# Patient Record
Sex: Female | Born: 1980 | Race: White | Hispanic: No | Marital: Married | Smoking: Never smoker
Health system: Southern US, Community
[De-identification: ages and names within clinical notes are randomized; demographics above are authoritative.]

## PROBLEM LIST (undated history)

## (undated) DIAGNOSIS — F32A Depression, unspecified: Secondary | ICD-10-CM

## (undated) DIAGNOSIS — R011 Cardiac murmur, unspecified: Secondary | ICD-10-CM

## (undated) DIAGNOSIS — M419 Scoliosis, unspecified: Secondary | ICD-10-CM

## (undated) DIAGNOSIS — F329 Major depressive disorder, single episode, unspecified: Secondary | ICD-10-CM

## (undated) DIAGNOSIS — T7840XA Allergy, unspecified, initial encounter: Secondary | ICD-10-CM

## (undated) HISTORY — PX: WISDOM TOOTH EXTRACTION: SHX21

---

## 2007-02-02 ENCOUNTER — Other Ambulatory Visit: Admission: RE | Admit: 2007-02-02 | Discharge: 2007-02-02 | Payer: Self-pay | Admitting: *Deleted

## 2008-10-12 ENCOUNTER — Other Ambulatory Visit: Admission: RE | Admit: 2008-10-12 | Discharge: 2008-10-12 | Payer: Self-pay | Admitting: Family Medicine

## 2008-11-19 ENCOUNTER — Other Ambulatory Visit: Admission: RE | Admit: 2008-11-19 | Discharge: 2008-11-19 | Payer: Self-pay | Admitting: Family Medicine

## 2009-12-05 ENCOUNTER — Other Ambulatory Visit: Admission: RE | Admit: 2009-12-05 | Discharge: 2009-12-05 | Payer: Self-pay | Admitting: Family Medicine

## 2010-02-02 ENCOUNTER — Emergency Department (HOSPITAL_COMMUNITY): Admission: EM | Admit: 2010-02-02 | Discharge: 2010-02-02 | Payer: Self-pay | Admitting: Family Medicine

## 2011-02-23 LAB — POCT URINALYSIS DIP (DEVICE)
Specific Gravity, Urine: 1.02 (ref 1.005–1.030)
Urobilinogen, UA: 1 mg/dL (ref 0.0–1.0)

## 2011-02-23 LAB — POCT PREGNANCY, URINE: Preg Test, Ur: NEGATIVE

## 2011-06-17 ENCOUNTER — Other Ambulatory Visit: Payer: Self-pay | Admitting: Family Medicine

## 2011-06-17 ENCOUNTER — Other Ambulatory Visit (HOSPITAL_COMMUNITY)
Admission: RE | Admit: 2011-06-17 | Discharge: 2011-06-17 | Disposition: A | Discharge status: Home / Self Care | Payer: BC Managed Care – PPO | Source: Ambulatory Visit | Attending: Family Medicine | Admitting: Family Medicine

## 2011-06-17 DIAGNOSIS — Z01419 Encounter for gynecological examination (general) (routine) without abnormal findings: Secondary | ICD-10-CM | POA: Insufficient documentation

## 2011-11-23 ENCOUNTER — Emergency Department (HOSPITAL_COMMUNITY)
Admission: EM | Admit: 2011-11-23 | Discharge: 2011-11-23 | Disposition: A | Discharge status: Home / Self Care | Payer: BC Managed Care – PPO | Source: Home / Self Care | Attending: Emergency Medicine | Admitting: Emergency Medicine

## 2011-11-23 ENCOUNTER — Encounter: Payer: Self-pay | Admitting: *Deleted

## 2011-11-23 DIAGNOSIS — J391 Other abscess of pharynx: Secondary | ICD-10-CM

## 2011-11-23 DIAGNOSIS — R6889 Other general symptoms and signs: Secondary | ICD-10-CM

## 2011-11-23 DIAGNOSIS — J392 Other diseases of pharynx: Secondary | ICD-10-CM

## 2011-11-23 HISTORY — DX: Major depressive disorder, single episode, unspecified: F32.9

## 2011-11-23 HISTORY — DX: Allergy, unspecified, initial encounter: T78.40XA

## 2011-11-23 HISTORY — DX: Depression, unspecified: F32.A

## 2011-11-23 MED ORDER — GUAIFENESIN-CODEINE 100-10 MG/5ML PO SYRP: 5.0000 mL | ORAL_SOLUTION | Freq: Three times a day (TID) | ORAL | Status: AC | PRN

## 2011-11-23 NOTE — ED Notes (Signed)
Fever  Cough   Body  Aches      Chills    Symptoms      Started  Almost  2  Weeks  Ago  Got  Worse  With  Fever  Last  Pm        Symptoms  Not  releived  By  otc  meds

## 2011-11-23 NOTE — ED Provider Notes (Signed)
History     CSN: [id number]  Arrival date & time 11/23/11  9147   First MD Initiated Contact with Patient 11/23/11 1032      Chief Complaint  Patient presents with  . Cough    (Consider location/radiation/quality/duration/timing/severity/associated sxs/prior treatment) HPI Comments: Coughing x 2 weeks, bit the congestion and sore throat with fevers started 2 days ago"  Patient is a 30 y.o. female presenting with cough. The history is provided by the patient.  Cough This is a new problem. The current episode started more than 2 days ago. The problem occurs constantly. The cough is non-productive. The maximum temperature recorded prior to her arrival was 101 to 101.9 F. Associated symptoms include rhinorrhea. Pertinent negatives include no chest pain, no chills, no weight loss, no ear congestion, no myalgias, no shortness of breath and no wheezing. She has tried decongestants for the symptoms. Her past medical history does not include bronchitis, pneumonia, COPD, emphysema or asthma.    Past Medical History  Diagnosis Date  . Allergic   . Depression     History reviewed. No pertinent past surgical history.  Family History  Problem Relation Age of Onset  . Asthma Mother   . Cancer Father   . Diabetes Father   . Heart failure Other     History  Substance Use Topics  . Smoking status: Never Smoker   . Smokeless tobacco: Not on file  . Alcohol Use: Yes    OB History    Grav Para Term Preterm Abortions TAB SAB Ect Mult Living                  Review of Systems  Constitutional: Negative for fever, chills and weight loss.  HENT: Positive for rhinorrhea.   Respiratory: Positive for cough. Negative for shortness of breath and wheezing.   Cardiovascular: Negative for chest pain.  Musculoskeletal: Negative for myalgias.    Allergies  Sulfa antibiotics  Home Medications   Current Outpatient Rx  Name Route Sig Dispense Refill  . MOMETASONE FUROATE 50 MCG/ACT NA  SUSP Nasal Place 2 sprays into the nose daily.      Marland Kitchen NORGESTIMATE-ETH ESTRADIOL 0.25-35 MG-MCG PO TABS Oral Take 1 tablet by mouth daily.      . VENLAFAXINE HCL 75 MG PO CP24 Oral Take 75 mg by mouth daily.      . GUAIFENESIN-CODEINE 100-10 MG/5ML PO SYRP Oral Take 5 mLs by mouth 3 (three) times daily as needed for cough. 120 mL 0    BP 114/72  Pulse 86  Temp(Src) 101.8 F (38.8 C) (Oral)  Resp 16  SpO2 100%  LMP 10/24/2011  Physical Exam  Nursing note and vitals reviewed. Constitutional: She appears well-developed and well-nourished.  Eyes: Conjunctivae are normal.  Neck: No JVD present.  Pulmonary/Chest: No respiratory distress. She has no wheezes. She has no rales.  Lymphadenopathy:    She has no cervical adenopathy.    ED Course  Procedures (including critical care time)  Labs Reviewed - No data to display No results found.   1. Influenza-like symptoms   2. Pharyngeal abscess       MDM  URI- ILI- with pots-nasal dripping        Jimmie Molly, MD 11/23/11 1800

## 2012-06-21 ENCOUNTER — Other Ambulatory Visit: Payer: Self-pay | Admitting: Family Medicine

## 2012-06-21 ENCOUNTER — Other Ambulatory Visit (HOSPITAL_COMMUNITY)
Admission: RE | Admit: 2012-06-21 | Discharge: 2012-06-21 | Disposition: A | Discharge status: Home / Self Care | Payer: BC Managed Care – PPO | Source: Ambulatory Visit | Attending: Family Medicine | Admitting: Family Medicine

## 2012-06-21 DIAGNOSIS — Z01419 Encounter for gynecological examination (general) (routine) without abnormal findings: Secondary | ICD-10-CM | POA: Insufficient documentation

## 2012-10-16 LAB — OB RESULTS CONSOLE RPR: RPR: NONREACTIVE

## 2012-10-16 LAB — OB RESULTS CONSOLE ABO/RH

## 2012-10-16 LAB — OB RESULTS CONSOLE ANTIBODY SCREEN: Antibody Screen: NEGATIVE

## 2012-11-30 NOTE — L&D Delivery Note (Signed)
SVD of VMI at 1217 on 05/04/13; APGARs 9,9, EBL 450cc; cord pH pending. Fetal heart tones in 90s x 3 minutes while crowning.  The patient was counseled for episiotomy vs vacuum assisted deliver.  She elected to proceed with vacuum assistance.  Kiwi was applied with a single push/pull effort affecting delivery of the head.  Head delivered OA with mouth and nose bulb suctioned.  Loose nuchal cord x 2 reduced.  Body delivered atraumatically.  Cord was clamped, cut, and baby to abdomen.  Cord pH obtained.  Placenta delivered S/I/3VC.  Fundus firmed with pitocin and massage.  Perineum intact.  Right periurethral laceration noted and hemostatic without repair.  Mom and baby stable.

## 2013-05-03 ENCOUNTER — Encounter (HOSPITAL_COMMUNITY): Payer: Self-pay | Admitting: *Deleted

## 2013-05-03 ENCOUNTER — Inpatient Hospital Stay (HOSPITAL_COMMUNITY)
Admission: AD | Admit: 2013-05-03 | Discharge: 2013-05-06 | DRG: 775 | Disposition: A | Discharge status: Home / Self Care | Payer: PRIVATE HEALTH INSURANCE | Source: Ambulatory Visit | Attending: Obstetrics and Gynecology | Admitting: Obstetrics and Gynecology

## 2013-05-03 DIAGNOSIS — O4290 Premature rupture of membranes, unspecified as to length of time between rupture and onset of labor, unspecified weeks of gestation: Principal | ICD-10-CM | POA: Diagnosis present

## 2013-05-03 HISTORY — DX: Cardiac murmur, unspecified: R01.1

## 2013-05-03 HISTORY — DX: Scoliosis, unspecified: M41.9

## 2013-05-03 LAB — CBC
Hemoglobin: 12.5 g/dL (ref 12.0–15.0)
MCH: 30.9 pg (ref 26.0–34.0)
RBC: 4.04 MIL/uL (ref 3.87–5.11)
WBC: 13.6 10*3/uL — ABNORMAL HIGH (ref 4.0–10.5)

## 2013-05-03 MED ORDER — LIDOCAINE HCL (PF) 1 % IJ SOLN
30.0000 mL | INTRAMUSCULAR | Status: DC | PRN
  Filled 2013-05-03: qty 30

## 2013-05-03 MED ORDER — ONDANSETRON HCL 4 MG/2ML IJ SOLN
4.0000 mg | Freq: Four times a day (QID) | INTRAMUSCULAR | Status: DC | PRN
  Administered 2013-05-04 (×2): 4 mg via INTRAVENOUS
  Filled 2013-05-03 (×3): qty 2

## 2013-05-03 MED ORDER — SODIUM CHLORIDE 0.9 % IV SOLN: 250.0000 mL | INTRAVENOUS | Status: DC | PRN

## 2013-05-03 MED ORDER — ACETAMINOPHEN 325 MG PO TABS: 650.0000 mg | ORAL_TABLET | ORAL | Status: DC | PRN

## 2013-05-03 MED ORDER — DIPHENHYDRAMINE HCL 50 MG/ML IJ SOLN: 12.5000 mg | INTRAMUSCULAR | Status: DC | PRN

## 2013-05-03 MED ORDER — BUTORPHANOL TARTRATE 1 MG/ML IJ SOLN: 1.0000 mg | INTRAMUSCULAR | Status: DC | PRN

## 2013-05-03 MED ORDER — OXYCODONE-ACETAMINOPHEN 5-325 MG PO TABS: 1.0000 | ORAL_TABLET | ORAL | Status: DC | PRN

## 2013-05-03 MED ORDER — SODIUM CHLORIDE 0.9 % IJ SOLN: 3.0000 mL | INTRAMUSCULAR | Status: DC | PRN

## 2013-05-03 MED ORDER — IBUPROFEN 600 MG PO TABS: 600.0000 mg | ORAL_TABLET | Freq: Four times a day (QID) | ORAL | Status: DC | PRN

## 2013-05-03 MED ORDER — PHENYLEPHRINE 40 MCG/ML (10ML) SYRINGE FOR IV PUSH (FOR BLOOD PRESSURE SUPPORT)
80.0000 ug | PREFILLED_SYRINGE | INTRAVENOUS | Status: DC | PRN
  Filled 2013-05-03: qty 2
  Filled 2013-05-03: qty 5

## 2013-05-03 MED ORDER — EPHEDRINE 5 MG/ML INJ
10.0000 mg | INTRAVENOUS | Status: DC | PRN
  Filled 2013-05-03: qty 2

## 2013-05-03 MED ORDER — SODIUM CHLORIDE 0.9 % IV SOLN
2.0000 g | Freq: Once | INTRAVENOUS | Status: AC
  Administered 2013-05-03: 2 g via INTRAVENOUS
  Filled 2013-05-03: qty 2000

## 2013-05-03 MED ORDER — FENTANYL 2.5 MCG/ML BUPIVACAINE 1/10 % EPIDURAL INFUSION (WH - ANES)
14.0000 mL/h | INTRAMUSCULAR | Status: DC | PRN
  Filled 2013-05-03: qty 125

## 2013-05-03 MED ORDER — LACTATED RINGERS IV SOLN
500.0000 mL | Freq: Once | INTRAVENOUS | Status: AC
  Administered 2013-05-04: 500 mL via INTRAVENOUS

## 2013-05-03 MED ORDER — SODIUM CHLORIDE 0.9 % IJ SOLN: 3.0000 mL | Freq: Two times a day (BID) | INTRAMUSCULAR | Status: DC

## 2013-05-03 MED ORDER — OXYTOCIN BOLUS FROM INFUSION: 500.0000 mL | INTRAVENOUS | Status: DC

## 2013-05-03 MED ORDER — OXYTOCIN 40 UNITS IN LACTATED RINGERS INFUSION - SIMPLE MED
62.5000 mL/h | INTRAVENOUS | Status: DC
  Administered 2013-05-04: 500 mL/h via INTRAVENOUS
  Filled 2013-05-03: qty 1000

## 2013-05-03 MED ORDER — CITRIC ACID-SODIUM CITRATE 334-500 MG/5ML PO SOLN: 30.0000 mL | ORAL | Status: DC | PRN

## 2013-05-03 MED ORDER — LACTATED RINGERS IV SOLN: 500.0000 mL | INTRAVENOUS | Status: DC | PRN

## 2013-05-03 MED ORDER — FLEET ENEMA 7-19 GM/118ML RE ENEM: 1.0000 | ENEMA | RECTAL | Status: DC | PRN

## 2013-05-03 MED ORDER — PHENYLEPHRINE 40 MCG/ML (10ML) SYRINGE FOR IV PUSH (FOR BLOOD PRESSURE SUPPORT)
80.0000 ug | PREFILLED_SYRINGE | INTRAVENOUS | Status: DC | PRN
  Filled 2013-05-03: qty 2

## 2013-05-03 MED ORDER — EPHEDRINE 5 MG/ML INJ
10.0000 mg | INTRAVENOUS | Status: DC | PRN
  Filled 2013-05-03: qty 4
  Filled 2013-05-03: qty 2

## 2013-05-03 NOTE — H&P (Signed)
Mariah Carson is a 32 y.o. female presenting for SROM last pm. Increased leaking of clear AF today requiring Depends. Some UCs. No fever, no HA, no vision change, no epigastric pain. Maternal Medical History:  Reason for admission: Rupture of membranes.   Contractions: Onset was 13-24 hours ago.   Frequency: irregular.   Perceived severity is moderate.    Fetal activity: Perceived fetal activity is normal.      OB History   Grav Para Term Preterm Abortions TAB SAB Ect Mult Living   1              Past Medical History  Diagnosis Date  . Allergic   . Depression   . Heart murmur    Past Surgical History  Procedure Laterality Date  . Wisdom tooth extraction Bilateral    Family History: family history includes Asthma in her mother; Cancer in her father; Diabetes in her father; and Heart failure in her other. Social History:  reports that she has never smoked. She has never used smokeless tobacco. She reports that she does not drink alcohol. Her drug history is not on file.   Prenatal Transfer Tool  Maternal Diabetes: No Genetic Screening: Normal Maternal Ultrasounds/Referrals: Normal Fetal Ultrasounds or other Referrals:  None Maternal Substance Abuse:  No Significant Maternal Medications:  None Significant Maternal Lab Results:  None Other Comments:  None  Review of Systems  Constitutional: Negative for fever.  Eyes: Negative for blurred vision.  Gastrointestinal: Negative for abdominal pain.  Neurological: Negative for headaches.    Dilation: 3.5 Effacement (%): 80 Station: -1;0 Exam by:: L Lamon RN Blood pressure 119/80, pulse 60, temperature 97.8 F (36.6 C), temperature source Oral, height 5\' 3"  (1.6 m), weight 152 lb (68.947 kg), last menstrual period 08/08/2012. Maternal Exam:  Uterine Assessment: Contraction strength is firm.  Contraction frequency is regular.   Abdomen: Patient reports no abdominal tenderness. Fetal presentation: vertex     Fetal  Exam Fetal Monitor Review: Pattern: accelerations present.       Physical Exam  Cardiovascular: Normal rate and regular rhythm.   Respiratory: Effort normal.  GI: Soft. She exhibits no distension.  Neurological: She has normal reflexes.    Prenatal labs: ABO, Rh: A/Positive/-- (11/17 0000) Antibody: Negative (11/17 0000) Rubella: Immune (11/17 0000) RPR: Nonreactive (11/17 0000)  HBsAg: Negative (11/17 0000)  HIV: Non-reactive (11/17 0000)  GBS: Negative (05/06 0000)   Assessment/Plan: 32 yo G1P0 at 77 2/7 weeks with PPROM now entering labor D/W patient and husband above   Anjali Manzella II,Donal Lynam E 05/03/2013, 10:26 PM

## 2013-05-04 ENCOUNTER — Encounter (HOSPITAL_COMMUNITY): Payer: Self-pay | Admitting: Anesthesiology

## 2013-05-04 ENCOUNTER — Encounter (HOSPITAL_COMMUNITY): Payer: Self-pay | Admitting: *Deleted

## 2013-05-04 ENCOUNTER — Inpatient Hospital Stay (HOSPITAL_COMMUNITY): Payer: PRIVATE HEALTH INSURANCE | Admitting: Anesthesiology

## 2013-05-04 LAB — RPR: RPR Ser Ql: NONREACTIVE

## 2013-05-04 MED ORDER — WITCH HAZEL-GLYCERIN EX PADS: 1.0000 "application " | MEDICATED_PAD | CUTANEOUS | Status: DC | PRN

## 2013-05-04 MED ORDER — SIMETHICONE 80 MG PO CHEW: 80.0000 mg | CHEWABLE_TABLET | ORAL | Status: DC | PRN

## 2013-05-04 MED ORDER — ZOLPIDEM TARTRATE 5 MG PO TABS: 5.0000 mg | ORAL_TABLET | Freq: Every evening | ORAL | Status: DC | PRN

## 2013-05-04 MED ORDER — LANOLIN HYDROUS EX OINT: TOPICAL_OINTMENT | CUTANEOUS | Status: DC | PRN

## 2013-05-04 MED ORDER — DIPHENHYDRAMINE HCL 25 MG PO CAPS: 25.0000 mg | ORAL_CAPSULE | Freq: Four times a day (QID) | ORAL | Status: DC | PRN

## 2013-05-04 MED ORDER — LIDOCAINE HCL (PF) 1 % IJ SOLN
INTRAMUSCULAR | Status: DC | PRN
  Administered 2013-05-04 (×2): 5 mL
  Administered 2013-05-04: 3 mL

## 2013-05-04 MED ORDER — FENTANYL 2.5 MCG/ML BUPIVACAINE 1/10 % EPIDURAL INFUSION (WH - ANES)
INTRAMUSCULAR | Status: DC | PRN
  Administered 2013-05-04: 14 mL/h via EPIDURAL

## 2013-05-04 MED ORDER — IBUPROFEN 600 MG PO TABS
600.0000 mg | ORAL_TABLET | Freq: Four times a day (QID) | ORAL | Status: DC
  Administered 2013-05-05 – 2013-05-06 (×7): 600 mg via ORAL
  Filled 2013-05-04 (×8): qty 1

## 2013-05-04 MED ORDER — SENNOSIDES-DOCUSATE SODIUM 8.6-50 MG PO TABS
2.0000 | ORAL_TABLET | Freq: Every day | ORAL | Status: DC
  Administered 2013-05-05: 2 via ORAL

## 2013-05-04 MED ORDER — BENZOCAINE-MENTHOL 20-0.5 % EX AERO
1.0000 "application " | INHALATION_SPRAY | CUTANEOUS | Status: DC | PRN
  Administered 2013-05-04: 1 via TOPICAL
  Filled 2013-05-04: qty 56

## 2013-05-04 MED ORDER — METHYLERGONOVINE MALEATE 0.2 MG/ML IJ SOLN
INTRAMUSCULAR | Status: AC
  Filled 2013-05-04: qty 1

## 2013-05-04 MED ORDER — PRENATAL MULTIVITAMIN CH
1.0000 | ORAL_TABLET | Freq: Every day | ORAL | Status: DC
  Administered 2013-05-06: 1 via ORAL
  Filled 2013-05-04 (×2): qty 1

## 2013-05-04 MED ORDER — OXYCODONE-ACETAMINOPHEN 5-325 MG PO TABS: 1.0000 | ORAL_TABLET | ORAL | Status: DC | PRN

## 2013-05-04 MED ORDER — ONDANSETRON HCL 4 MG PO TABS: 4.0000 mg | ORAL_TABLET | ORAL | Status: DC | PRN

## 2013-05-04 MED ORDER — DIBUCAINE 1 % RE OINT: 1.0000 "application " | TOPICAL_OINTMENT | RECTAL | Status: DC | PRN

## 2013-05-04 MED ORDER — TETANUS-DIPHTH-ACELL PERTUSSIS 5-2.5-18.5 LF-MCG/0.5 IM SUSP: 0.5000 mL | Freq: Once | INTRAMUSCULAR | Status: DC

## 2013-05-04 MED ORDER — ONDANSETRON HCL 4 MG/2ML IJ SOLN: 4.0000 mg | INTRAMUSCULAR | Status: DC | PRN

## 2013-05-04 NOTE — Progress Notes (Signed)
Cheyrl Buley is a 32 y.o. G1P0 at [redacted]w[redacted]d by ultrasound admitted for PROM  Subjective: No current c/o.  Comfortable with epidural.    Objective: BP 118/52  Pulse 66  Temp(Src) 98.6 F (37 C) (Oral)  Resp 18  Ht 5\' 3"  (1.6 m)  Wt 68.947 kg (152 lb)  BMI 26.93 kg/m2  SpO2 100%  LMP 08/08/2012      FHT:  FHR: 130 bpm, variability: moderate,  accelerations:  Present,  decelerations:  Absent UC:   regular, every 2 minutes SVE:   Dilation: 6 Effacement (%): 100 Station: +1 Exam by:: lee  Labs: Lab Results  Component Value Date   WBC 13.6* 05/03/2013   HGB 12.5 05/03/2013   HCT 35.8* 05/03/2013   MCV 88.6 05/03/2013   PLT 156 05/03/2013    Assessment / Plan: Spontaneous labor, progressing normally  Labor: Progressing normally Preeclampsia:  n/a Fetal Wellbeing:  Category I Pain Control:  Epidural I/D:  n/a Anticipated MOD:  NSVD  Cheryel Kyte 05/04/2013, 7:51 AM

## 2013-05-04 NOTE — Anesthesia Preprocedure Evaluation (Signed)

## 2013-05-04 NOTE — Anesthesia Procedure Notes (Signed)
Epidural Patient location during procedure: OB  Staffing Anesthesiologist: Jamaria Amborn Performed by: anesthesiologist   Preanesthetic Checklist Completed: patient identified, site marked, surgical consent, pre-op evaluation, timeout performed, IV checked, risks and benefits discussed and monitors and equipment checked  Epidural Patient position: sitting Prep: ChloraPrep Patient monitoring: heart rate, continuous pulse ox and blood pressure Approach: midline Injection technique: LOR saline  Needle:  Needle type: Tuohy  Needle gauge: 17 G Needle length: 9 cm and 9 Needle insertion depth: 5 cm Catheter type: closed end flexible Catheter size: 20 Guage Catheter at skin depth: 10 cm Test dose: negative  Assessment Events: blood not aspirated, injection not painful, no injection resistance, negative IV test and no paresthesia  Additional Notes   Patient tolerated the insertion well without complications.   

## 2013-05-05 LAB — CBC
HCT: 23.9 % — ABNORMAL LOW (ref 36.0–46.0)
MCH: 31.3 pg (ref 26.0–34.0)
MCHC: 35.1 g/dL (ref 30.0–36.0)
MCV: 89.2 fL (ref 78.0–100.0)
RDW: 14.2 % (ref 11.5–15.5)

## 2013-05-05 NOTE — Anesthesia Postprocedure Evaluation (Signed)
  Anesthesia Post-op Note  Patient: Mariah Carson  Procedure(s) Performed: * No procedures listed *  Patient Location: Mother/Baby  Anesthesia Type:Epidural  Level of Consciousness: awake, alert  and oriented  Airway and Oxygen Therapy: Patient Spontanous Breathing  Post-op Pain: mild  Post-op Assessment: Post-op Vital signs reviewed, Pain level controlled, No headache, No backache, No residual numbness and No residual motor weakness  Post-op Vital Signs: Reviewed and stable  Complications: No apparent anesthesia complications

## 2013-05-05 NOTE — Progress Notes (Signed)
CSW met with pt briefly to discuss history of depression/anxiety. Her symptoms were treated with Effexor until 2/13, when she decided to stop. She has coped well without medication during the pregnancy & does not plan to restart medication right away. She denies any recent or current depression. CSW discussed PP depression signs/symptoms with pt & provided her with literature to review. She identifies her spouse, Mariah Carson, as her primary support person. She has all the necessary supplies for the infant & appears to be bonding well. CSW available to assist further if needed.      

## 2013-05-05 NOTE — Progress Notes (Signed)
Post Partum Day 1 Subjective: no complaints, up ad lib, voiding, tolerating PO and + flatus  Objective: Blood pressure 92/63, pulse 61, temperature 97.9 F (36.6 C), temperature source Oral, resp. rate 18, height 5\' 3"  (1.6 m), weight 152 lb (68.947 kg), last menstrual period 08/08/2012, SpO2 98.00%, unknown if currently breastfeeding.  Physical Exam:  General: alert and cooperative Lochia: appropriate Uterine Fundus: firm Incision: perineum intact, no significant labial edema noted DVT Evaluation: No evidence of DVT seen on physical exam. Negative Homan's sign. No cords or calf tenderness. No significant calf/ankle edema.   Recent Labs  05/03/13 2235 05/05/13 0603  HGB 12.5 8.4*  HCT 35.8* 23.9*    Assessment/Plan: Plan for discharge tomorrow, does not desire circ   LOS: 2 days   Jennfer Gassen G 05/05/2013, 8:06 AM

## 2013-05-06 MED ORDER — IBUPROFEN 600 MG PO TABS: 600.0000 mg | ORAL_TABLET | Freq: Four times a day (QID) | ORAL | Status: DC

## 2013-05-06 NOTE — Discharge Summary (Signed)
Obstetric Discharge Summary Reason for Admission: onset of labor Prenatal Procedures: ultrasound Intrapartum Procedures: spontaneous vaginal delivery Postpartum Procedures: none Complications-Operative and Postpartum: none Hemoglobin  Date Value Range Status  05/05/2013 8.4* 12.0 - 15.0 g/dL Final     REPEATED TO VERIFY     DELTA CHECK NOTED     HCT  Date Value Range Status  05/05/2013 23.9* 36.0 - 46.0 % Final    Physical Exam:  General: alert and cooperative Lochia: appropriate Uterine Fundus: firm Incision: n/a DVT Evaluation: No evidence of DVT seen on physical exam.  Discharge Diagnoses: Term Pregnancy-delivered  Discharge Information: Date: 05/06/2013 Activity: pelvic rest Diet: routine Medications: PNV and Ibuprofen Condition: stable Instructions: refer to practice specific booklet Discharge to: home Follow-up Information   Schedule an appointment as soon as possible for a visit in 4 weeks to follow up.      Newborn Data: Live born female  Birth Weight: 6 lb 1.7 oz (2770 g) APGAR: 9, 9  Home with mother.  Mariah Carson 05/06/2013, 7:30 AM

## 2014-10-01 ENCOUNTER — Encounter (HOSPITAL_COMMUNITY): Payer: Self-pay | Admitting: *Deleted

## 2015-08-22 ENCOUNTER — Other Ambulatory Visit (HOSPITAL_COMMUNITY)
Admission: RE | Admit: 2015-08-22 | Discharge: 2015-08-22 | Disposition: A | Discharge status: Home / Self Care | Payer: PRIVATE HEALTH INSURANCE | Source: Ambulatory Visit | Attending: Family Medicine | Admitting: Family Medicine

## 2015-08-22 ENCOUNTER — Other Ambulatory Visit: Payer: Self-pay | Admitting: Family Medicine

## 2015-08-22 DIAGNOSIS — Z124 Encounter for screening for malignant neoplasm of cervix: Secondary | ICD-10-CM | POA: Insufficient documentation

## 2015-08-27 LAB — CYTOLOGY - PAP

## 2017-11-30 NOTE — L&D Delivery Note (Signed)
Delivery Note  SVD viable female Apgars 8,9 over intact perineum.  Placenta delivered spontaneously intact with 3VC. Good support and hemostasis noted. .  PH art was sent.   Mother and baby to couplet care and are doing well.  EBL 100cc  Candice Camp, MD

## 2018-02-01 LAB — OB RESULTS CONSOLE GC/CHLAMYDIA
Chlamydia: NEGATIVE
Gonorrhea: NEGATIVE

## 2018-02-01 LAB — OB RESULTS CONSOLE RUBELLA ANTIBODY, IGM: RUBELLA: IMMUNE

## 2018-02-01 LAB — OB RESULTS CONSOLE RPR: RPR: NONREACTIVE

## 2018-02-01 LAB — OB RESULTS CONSOLE ABO/RH: RH TYPE: POSITIVE

## 2018-02-01 LAB — OB RESULTS CONSOLE HIV ANTIBODY (ROUTINE TESTING): HIV: NONREACTIVE

## 2018-02-01 LAB — OB RESULTS CONSOLE HEPATITIS B SURFACE ANTIGEN: Hepatitis B Surface Ag: NEGATIVE

## 2018-02-01 LAB — OB RESULTS CONSOLE ANTIBODY SCREEN: Antibody Screen: NEGATIVE

## 2018-08-28 LAB — OB RESULTS CONSOLE GBS: GBS: NEGATIVE

## 2018-08-30 ENCOUNTER — Inpatient Hospital Stay (HOSPITAL_COMMUNITY): Payer: PRIVATE HEALTH INSURANCE | Admitting: Anesthesiology

## 2018-08-30 ENCOUNTER — Encounter (HOSPITAL_COMMUNITY): Payer: Self-pay | Admitting: *Deleted

## 2018-08-30 ENCOUNTER — Other Ambulatory Visit: Payer: Self-pay

## 2018-08-30 ENCOUNTER — Inpatient Hospital Stay (HOSPITAL_COMMUNITY)
Admission: AD | Admit: 2018-08-30 | Discharge: 2018-09-01 | DRG: 807 | Disposition: A | Discharge status: Home / Self Care | Payer: PRIVATE HEALTH INSURANCE | Attending: Obstetrics and Gynecology | Admitting: Obstetrics and Gynecology

## 2018-08-30 DIAGNOSIS — Z23 Encounter for immunization: Secondary | ICD-10-CM | POA: Diagnosis not present

## 2018-08-30 DIAGNOSIS — Z3483 Encounter for supervision of other normal pregnancy, third trimester: Secondary | ICD-10-CM | POA: Diagnosis present

## 2018-08-30 DIAGNOSIS — Z3A39 39 weeks gestation of pregnancy: Secondary | ICD-10-CM | POA: Diagnosis not present

## 2018-08-30 LAB — ABO/RH: ABO/RH(D): A POS

## 2018-08-30 LAB — TYPE AND SCREEN
ABO/RH(D): A POS
ANTIBODY SCREEN: NEGATIVE

## 2018-08-30 LAB — CBC
HEMATOCRIT: 33.2 % — AB (ref 36.0–46.0)
Hemoglobin: 11.7 g/dL — ABNORMAL LOW (ref 12.0–15.0)
MCH: 32.1 pg (ref 26.0–34.0)
MCHC: 35.2 g/dL (ref 30.0–36.0)
MCV: 91 fL (ref 78.0–100.0)
Platelets: 129 10*3/uL — ABNORMAL LOW (ref 150–400)
RBC: 3.65 MIL/uL — AB (ref 3.87–5.11)
RDW: 14.2 % (ref 11.5–15.5)
WBC: 12.6 10*3/uL — AB (ref 4.0–10.5)

## 2018-08-30 LAB — RPR: RPR Ser Ql: NONREACTIVE

## 2018-08-30 MED ORDER — SOD CITRATE-CITRIC ACID 500-334 MG/5ML PO SOLN: 30.0000 mL | ORAL | Status: DC | PRN

## 2018-08-30 MED ORDER — ONDANSETRON HCL 4 MG PO TABS: 4.0000 mg | ORAL_TABLET | ORAL | Status: DC | PRN

## 2018-08-30 MED ORDER — ONDANSETRON HCL 4 MG/2ML IJ SOLN: 4.0000 mg | INTRAMUSCULAR | Status: DC | PRN

## 2018-08-30 MED ORDER — MEASLES, MUMPS & RUBELLA VAC ~~LOC~~ INJ
0.5000 mL | INJECTION | Freq: Once | SUBCUTANEOUS | Status: DC
  Filled 2018-08-30: qty 0.5

## 2018-08-30 MED ORDER — SENNOSIDES-DOCUSATE SODIUM 8.6-50 MG PO TABS
2.0000 | ORAL_TABLET | ORAL | Status: DC
  Administered 2018-08-30 – 2018-09-01 (×2): 2 via ORAL
  Filled 2018-08-30 (×2): qty 2

## 2018-08-30 MED ORDER — OXYTOCIN 10 UNIT/ML IJ SOLN
10.0000 [IU] | Freq: Once | INTRAMUSCULAR | Status: DC | PRN
  Filled 2018-08-30: qty 1

## 2018-08-30 MED ORDER — PRENATAL MULTIVITAMIN CH
1.0000 | ORAL_TABLET | Freq: Every day | ORAL | Status: DC
  Administered 2018-08-31: 1 via ORAL
  Filled 2018-08-30: qty 1

## 2018-08-30 MED ORDER — LACTATED RINGERS IV SOLN: 500.0000 mL | INTRAVENOUS | Status: DC | PRN

## 2018-08-30 MED ORDER — OXYTOCIN 40 UNITS IN LACTATED RINGERS INFUSION - SIMPLE MED
2.5000 [IU]/h | INTRAVENOUS | Status: DC
  Administered 2018-08-30: 2.5 [IU]/h via INTRAVENOUS

## 2018-08-30 MED ORDER — DIBUCAINE 1 % RE OINT: 1.0000 "application " | TOPICAL_OINTMENT | RECTAL | Status: DC | PRN

## 2018-08-30 MED ORDER — EPHEDRINE 5 MG/ML INJ
10.0000 mg | INTRAVENOUS | Status: DC | PRN
  Filled 2018-08-30: qty 2

## 2018-08-30 MED ORDER — ACETAMINOPHEN 325 MG PO TABS: 650.0000 mg | ORAL_TABLET | ORAL | Status: DC | PRN

## 2018-08-30 MED ORDER — IBUPROFEN 600 MG PO TABS
600.0000 mg | ORAL_TABLET | Freq: Four times a day (QID) | ORAL | Status: DC
  Administered 2018-08-30 – 2018-08-31 (×3): 600 mg via ORAL
  Filled 2018-08-30 (×7): qty 1

## 2018-08-30 MED ORDER — OXYTOCIN BOLUS FROM INFUSION
500.0000 mL | Freq: Once | INTRAVENOUS | Status: AC
  Administered 2018-08-30: 500 mL via INTRAVENOUS

## 2018-08-30 MED ORDER — OXYTOCIN 40 UNITS IN LACTATED RINGERS INFUSION - SIMPLE MED
2.5000 [IU]/h | INTRAVENOUS | Status: DC
  Filled 2018-08-30: qty 1000

## 2018-08-30 MED ORDER — FENTANYL 2.5 MCG/ML BUPIVACAINE 1/10 % EPIDURAL INFUSION (WH - ANES)
14.0000 mL/h | INTRAMUSCULAR | Status: DC | PRN
  Administered 2018-08-30: 14 mL/h via EPIDURAL
  Filled 2018-08-30: qty 100

## 2018-08-30 MED ORDER — OXYCODONE-ACETAMINOPHEN 5-325 MG PO TABS: 1.0000 | ORAL_TABLET | ORAL | Status: DC | PRN

## 2018-08-30 MED ORDER — COCONUT OIL OIL
1.0000 "application " | TOPICAL_OIL | Status: DC | PRN
  Administered 2018-08-31: 1 via TOPICAL
  Filled 2018-08-30: qty 120

## 2018-08-30 MED ORDER — PHENYLEPHRINE 40 MCG/ML (10ML) SYRINGE FOR IV PUSH (FOR BLOOD PRESSURE SUPPORT)
80.0000 ug | PREFILLED_SYRINGE | INTRAVENOUS | Status: DC | PRN
  Filled 2018-08-30: qty 10
  Filled 2018-08-30: qty 5

## 2018-08-30 MED ORDER — MEDROXYPROGESTERONE ACETATE 150 MG/ML IM SUSP: 150.0000 mg | INTRAMUSCULAR | Status: DC | PRN

## 2018-08-30 MED ORDER — OXYCODONE-ACETAMINOPHEN 5-325 MG PO TABS: 2.0000 | ORAL_TABLET | ORAL | Status: DC | PRN

## 2018-08-30 MED ORDER — LACTATED RINGERS IV SOLN: 500.0000 mL | Freq: Once | INTRAVENOUS | Status: DC

## 2018-08-30 MED ORDER — OXYTOCIN BOLUS FROM INFUSION: 500.0000 mL | Freq: Once | INTRAVENOUS | Status: DC

## 2018-08-30 MED ORDER — LIDOCAINE HCL (PF) 1 % IJ SOLN: 30.0000 mL | INTRAMUSCULAR | Status: DC | PRN

## 2018-08-30 MED ORDER — LIDOCAINE HCL (PF) 1 % IJ SOLN
INTRAMUSCULAR | Status: DC | PRN
  Administered 2018-08-30: 8 mL via EPIDURAL

## 2018-08-30 MED ORDER — DIPHENHYDRAMINE HCL 25 MG PO CAPS: 25.0000 mg | ORAL_CAPSULE | Freq: Four times a day (QID) | ORAL | Status: DC | PRN

## 2018-08-30 MED ORDER — BENZOCAINE-MENTHOL 20-0.5 % EX AERO: 1.0000 "application " | INHALATION_SPRAY | CUTANEOUS | Status: DC | PRN

## 2018-08-30 MED ORDER — INFLUENZA VAC SPLIT QUAD 0.5 ML IM SUSY
0.5000 mL | PREFILLED_SYRINGE | INTRAMUSCULAR | Status: AC
  Administered 2018-08-31: 0.5 mL via INTRAMUSCULAR
  Filled 2018-08-30: qty 0.5

## 2018-08-30 MED ORDER — LACTATED RINGERS IV SOLN
INTRAVENOUS | Status: DC
  Administered 2018-08-30: 08:00:00 via INTRAVENOUS

## 2018-08-30 MED ORDER — LIDOCAINE HCL (PF) 1 % IJ SOLN
30.0000 mL | INTRAMUSCULAR | Status: DC | PRN
  Filled 2018-08-30: qty 30

## 2018-08-30 MED ORDER — LACTATED RINGERS IV SOLN: INTRAVENOUS | Status: DC

## 2018-08-30 MED ORDER — PHENYLEPHRINE 40 MCG/ML (10ML) SYRINGE FOR IV PUSH (FOR BLOOD PRESSURE SUPPORT)
80.0000 ug | PREFILLED_SYRINGE | INTRAVENOUS | Status: DC | PRN
  Filled 2018-08-30: qty 5

## 2018-08-30 MED ORDER — SIMETHICONE 80 MG PO CHEW: 80.0000 mg | CHEWABLE_TABLET | ORAL | Status: DC | PRN

## 2018-08-30 MED ORDER — TETANUS-DIPHTH-ACELL PERTUSSIS 5-2.5-18.5 LF-MCG/0.5 IM SUSP: 0.5000 mL | Freq: Once | INTRAMUSCULAR | Status: DC

## 2018-08-30 MED ORDER — DIPHENHYDRAMINE HCL 50 MG/ML IJ SOLN: 12.5000 mg | INTRAMUSCULAR | Status: DC | PRN

## 2018-08-30 MED ORDER — ZOLPIDEM TARTRATE 5 MG PO TABS: 5.0000 mg | ORAL_TABLET | Freq: Every evening | ORAL | Status: DC | PRN

## 2018-08-30 MED ORDER — WITCH HAZEL-GLYCERIN EX PADS: 1.0000 "application " | MEDICATED_PAD | CUTANEOUS | Status: DC | PRN

## 2018-08-30 MED ORDER — ONDANSETRON HCL 4 MG/2ML IJ SOLN
4.0000 mg | Freq: Four times a day (QID) | INTRAMUSCULAR | Status: DC | PRN
  Administered 2018-08-30: 4 mg via INTRAVENOUS
  Filled 2018-08-30: qty 2

## 2018-08-30 NOTE — H&P (Signed)
Aftin Lye is a 37 y.o. female presenting for SROM and labor.  Preg complicated by AMA with normal NIPS.  GBS-. OB History    Gravida  2   Para  1   Term  1   Preterm      AB      Living  1     SAB      TAB      Ectopic      Multiple      Live Births  1          Past Medical History:  Diagnosis Date  . Allergic   . Depression   . Heart murmur   . Scoliosis    Past Surgical History:  Procedure Laterality Date  . WISDOM TOOTH EXTRACTION Bilateral    Family History: family history includes Asthma in her mother; Cancer in her father; Diabetes in her father; Heart failure in her other. Social History:  reports that she has never smoked. She has never used smokeless tobacco. She reports that she does not drink alcohol. Her drug history is not on file.     Maternal Diabetes: No Genetic Screening: Normal Maternal Ultrasounds/Referrals: Normal Fetal Ultrasounds or other Referrals:  None Maternal Substance Abuse:  No Significant Maternal Medications:  None Significant Maternal Lab Results:  None Other Comments:  None  ROS History Dilation: 6 Effacement (%): 100 Station: -2 Exam by:: ginger morris rn Blood pressure 106/63, pulse (!) 50, temperature (!) 97.1 F (36.2 C), resp. rate 16, height 5\' 3"  (1.6 m), weight 70.3 kg, last menstrual period 11/29/2017, unknown if currently breastfeeding. Exam Physical Exam  Prenatal labs: ABO, Rh: A/Positive/-- (03/05 0000) Antibody: Negative (03/05 0000) Rubella: Immune (03/05 0000) RPR: Nonreactive (03/05 0000)  HBsAg: Negative (03/05 0000)  HIV: Non-reactive (03/05 0000)  GBS: Negative (09/29 0000)   Assessment/Plan: IUP at term Labor Anticipate SVD   Turner Daniels 08/30/2018, 8:56 AM

## 2018-08-30 NOTE — Anesthesia Procedure Notes (Signed)
Epidural Patient location during procedure: OB Start time: 08/30/2018 9:40 AM End time: 08/30/2018 9:50 AM  Staffing Anesthesiologist: Bethena Midget, MD  Preanesthetic Checklist Completed: patient identified, site marked, surgical consent, pre-op evaluation, timeout performed, IV checked, risks and benefits discussed and monitors and equipment checked  Epidural Patient position: sitting Prep: site prepped and draped and DuraPrep Patient monitoring: continuous pulse ox and blood pressure Approach: midline Location: L4-L5 Injection technique: LOR air  Needle:  Needle type: Tuohy  Needle gauge: 17 G Needle length: 9 cm and 9 Needle insertion depth: 5 cm cm Catheter type: closed end flexible Catheter size: 19 Gauge Catheter at skin depth: 10 cm Test dose: negative  Assessment Events: blood not aspirated, injection not painful, no injection resistance, negative IV test and no paresthesia

## 2018-08-30 NOTE — MAU Note (Signed)
Pt presents to MAU with complaints of contractions for an hour. SROM at 0630, clear fluid

## 2018-08-30 NOTE — Anesthesia Preprocedure Evaluation (Signed)

## 2018-08-30 NOTE — Anesthesia Postprocedure Evaluation (Signed)
Anesthesia Post Note  Patient: Mariah Carson  Procedure(s) Performed: AN AD HOC LABOR EPIDURAL     Patient location during evaluation: Mother Baby Anesthesia Type: Epidural Level of consciousness: awake and alert Pain management: pain level controlled Vital Signs Assessment: post-procedure vital signs reviewed and stable Respiratory status: spontaneous breathing, nonlabored ventilation and respiratory function stable Cardiovascular status: stable Postop Assessment: no headache, no backache and epidural receding Anesthetic complications: no    Last Vitals:  Vitals:   08/30/18 1300 08/30/18 1400  BP: 110/83 113/71  Pulse: (!) 45 (!) 47  Resp: 16 16  Temp: 36.5 C (!) 36.2 C  SpO2: 100%     Last Pain:  Vitals:   08/30/18 1400  TempSrc: Axillary  PainSc: 0-No pain   Pain Goal: Patients Stated Pain Goal: 5 (08/30/18 1000)               Leighton Luster N

## 2018-08-30 NOTE — Lactation Note (Addendum)
This note was copied from a baby's chart. Lactation Consultation Note  Patient Name: Mariah Carson [id number] Date: 08/30/2018 Reason for consult: Initial assessment;Term P2, 10 hour female, term Per mom, infant had 3 stools and 1 wet diaper. Per mom, had thrust with son used lanolin, LC discussed lanolin is not recommended advised use coconut oil  and EBM for nipple soreness.  Mom was at the end of a feeding as LC entered the room, mom been BF infant for 25 minutes, infant latched on left breast in cross-cradle position. Mom burped baby afterwards. Mom demonstrated hand expressed and colostrum present. LC notice abrasion on right breast.  Mom asked LC look at her right breast due soreness and LC notice abraison on breast, LC gave comfort gels and explained how to use. Discussed with mom, infant mouth should have wide gape when latching, with tongue down. Mom she feel tug if latch is painful break latch and try again to prevent sore nipples. Mom can call nurse or LC for latch  assistance with next feed if needed. LC discussed I&O. LC discussed hunger cues, BF 8 to 12 times within 24 hours including nights. Reviewed Baby & Me book's Breastfeeding Basics.  Mom made aware of O/P services, breastfeeding support groups, community resources, and our phone # for post-discharge questions.   Maternal Data Formula Feeding for Exclusion: No Has patient been taught Hand Expression?: Yes(Mom demostrated hand expression to LC and clostrum is present.) Does the patient have breastfeeding experience prior to this delivery?: Yes  Feeding Feeding Type: Breast Fed Length of feed: 25 min(Per mom BF 25 minutes, end feeding LC entered room.)  LATCH Score Latch: Grasps breast easily, tongue down, lips flanged, rhythmical sucking.  Audible Swallowing: Spontaneous and intermittent  Type of Nipple: Everted at rest and after stimulation  Comfort (Breast/Nipple): Soft / non-tender  Hold (Positioning): No  assistance needed to correctly position infant at breast.  LATCH Score: 10  Interventions Interventions: Breast feeding basics reviewed;Breast massage;Comfort gels;Support pillows;Adjust position  Lactation Tools Discussed/Used WIC Program: No   Consult Status Consult Status: Follow-up Date: 08/31/18 Follow-up type: In-patient    Danelle Earthly 08/30/2018, 9:49 PM

## 2018-08-31 ENCOUNTER — Other Ambulatory Visit: Payer: Self-pay

## 2018-08-31 LAB — CBC
HCT: 31.7 % — ABNORMAL LOW (ref 36.0–46.0)
HEMOGLOBIN: 11 g/dL — AB (ref 12.0–15.0)
MCH: 31.6 pg (ref 26.0–34.0)
MCHC: 34.7 g/dL (ref 30.0–36.0)
MCV: 91.1 fL (ref 78.0–100.0)
PLATELETS: 137 10*3/uL — AB (ref 150–400)
RBC: 3.48 MIL/uL — ABNORMAL LOW (ref 3.87–5.11)
RDW: 14.5 % (ref 11.5–15.5)
WBC: 12 10*3/uL — AB (ref 4.0–10.5)

## 2018-08-31 NOTE — Discharge Summary (Signed)
Obstetric Discharge Summary Reason for Admission: rupture of membranes Prenatal Procedures: none Intrapartum Procedures: spontaneous vaginal delivery Postpartum Procedures: none Complications-Operative and Postpartum: none Hemoglobin  Date Value Ref Range Status  08/31/2018 11.0 (L) 12.0 - 15.0 g/dL Final   HCT  Date Value Ref Range Status  08/31/2018 31.7 (L) 36.0 - 46.0 % Final    Physical Exam:  General: alert and cooperative Lochia: appropriate Uterine Fundus: firm Incision: healing well, no significant drainage, no dehiscence DVT Evaluation: No evidence of DVT seen on physical exam.  Discharge Diagnoses: Term Pregnancy-delivered  Discharge Information: Date: 08/31/2018 Activity: pelvic rest Diet: routine Medications: None Condition: improved Instructions: refer to practice specific booklet Discharge to: home   Newborn Data: Live born female  Birth Weight: 6 lb 10.2 oz (3011 g) APGAR: 8, 9  Newborn Delivery   Birth date/time:  08/30/2018 11:19:00 Delivery type:  Vaginal, Spontaneous     Home with mother.  Lyberti Thrush L 08/31/2018, 8:55 AM

## 2018-08-31 NOTE — Progress Notes (Signed)
MOB was referred for history of depression/anxiety. * Referral screened out by Clinical Social Worker because none of the following criteria appear to apply: ~ History of anxiety/depression during this pregnancy, or of post-partum depression following prior delivery. ~ Diagnosis of anxiety and/or depression within last 3 years OR * MOB's symptoms currently being treated with medication and/or therapy. Please contact the Clinical Social Worker if needs arise, by MOB request, or if MOB scores greater than 9/yes to question 10 on Edinburgh Postpartum Depression Screen.  Terrianne Cavness, LCSW Clinical Social Worker  [facility]  [phone]  

## 2018-08-31 NOTE — Progress Notes (Signed)
Baby transferred to NICU this afternoon. Pt requests to stay another night to be closer to baby.  Phoned Dr. Vincente Poli.  See order to discontinue discharge.

## 2018-08-31 NOTE — Lactation Note (Signed)
This note was copied from a baby's chart. Lactation Consultation Note  Patient Name: Mariah Carson [id number] Date: 08/31/2018 Reason for consult: Follow-up assessment;Infant weight loss;Term(3% weight loss / P2 exp BF / mom wants Early D/C today )  Baby is 32 hours old  LC reviewed and updated the doc flow sheets per dad ' Mom feels breast feeding is going well after the [facility] assisted her  To get the baby to open wider at the breast. LC also discussed the best positions  To consistently obtain the depth. Baby last fed at 910 am for 35 mins.  Mom already has comfort gels, LC instructed mom on the use of shells while awake,  And to alternate the two. LC recommended prior to latch - breast massage, hand express,  Pre-pump with hand pump if needed ( per mom has a Manual and a DEBP at home).  Use EBM to nipples liberally.  Mother informed of post-discharge support and given phone number to the lactation department, including services for phone call assistance; out-patient appointments; and breastfeeding support group. List of other breastfeeding resources in the community given in the handout. Encouraged mother to call for problems or concerns related to breastfeeding.    Maternal Data    Feeding Feeding Type: (per mom baby last fed at 910amd for 35 mins ) Length of feed: 25 min  LATCH Score Latch: Grasps breast easily, tongue down, lips flanged, rhythmical sucking.  Audible Swallowing: Spontaneous and intermittent  Type of Nipple: Everted at rest and after stimulation  Comfort (Breast/Nipple): Filling, red/small blisters or bruises, mild/mod discomfort  Hold (Positioning): No assistance needed to correctly position infant at breast.  LATCH Score: 9  Interventions Interventions: Breast feeding basics reviewed;Comfort gels;Shells  Lactation Tools Discussed/Used Tools: Comfort gels;Shells(per mom right nipple is tender / already has comfort gels , LC instructed on the use shells  ) Shell Type: Inverted   Consult Status Consult Status: Complete Date: 08/31/18    Kathrin Greathouse 08/31/2018, 10:37 AM

## 2018-09-01 NOTE — Lactation Note (Addendum)
This note was copied from a baby's chart. Lactation Consultation Note  Patient Name: Mariah Carson [id number] Date: 09/01/2018 Reason for consult: Follow-up assessment;NICU baby;Term(milk coming  to volume )  Baby is 76 hours old and was transferred to NICU yesterday due to failing CHD screening.  Mom mentioned this am the baby's O2 levels go down when off the O2 and is not been breast feeding  Since transferred.  Mom pumping both breast with Symphony as LC entered the room with #24 F , LC checked  And it appeared to be snug at the base and per mom comfortable.  LC recommended and encouraged to try the #27's in the DEBP kit and see if they are less snug.  Also to use a dab of coconut oil on both nipples prior to pumping to decrease the friction.  LC recommended to enhance EBM yield - hand express before and after pumping. Mom presently  Wearing a pumping bra. LC recommended to try some pumps so she can massage breast while pumping To enhance let down. Sore nipple and engorgement prevention and tx reviewed.  LC encouraged mom to bring her DEBP kit to the hospital when visiting baby in NICU and plan on  Pumping during visit.  Per mom has DEBP Medela at home.  LC stressed the importance of the 1st 2 weeks pumping and to be consistent with around the clock pumping 8-12 x's a day both breast for 15 -20 mins.  Rest , naps, plenty of fluids, and nutritious snacks and meals.  [place] praised mom for her efforts pumping and LC noted about 20 ml pumped already and still pumping.  LC reviewed the maintenance mode of the DEBP.  Mom , dad and grandmother receptive to teaching.  Mother informed of post-discharge support and given phone number to the lactation department, including services for phone call assistance; out-patient appointments; and breastfeeding support group. List of other breastfeeding resources in the community given in the handout. Encouraged mother to call for problems or concerns related  to breastfeeding.  NICU Breast feeding booklet given to mom ,  Colostrum containers ,storage bottles, and she has labels.    Maternal Data Has patient been taught Hand Expression?: Yes(LC enc prior to pumping and afterwards )  Feeding    LATCH Score                   Interventions Interventions: Breast feeding basics reviewed;DEBP  Lactation Tools Discussed/Used Tools: Pump;Comfort gels;Coconut oil;Flanges;Shells Flange Size: 24;27;Other (comment)(mom aware to increase size when milk is coming in if to snug ) Shell Type: Inverted Breast pump type: Double-Electric Breast Pump Pump Review: Setup, frequency, and cleaning;Milk Storage Initiated by:: MAI  Date initiated:: 09/01/18   Consult Status Consult Status: PRN Follow-up type: Other (comment)(baby in NICU )    [place] 09/01/2018, 9:29 AM

## 2018-09-01 NOTE — Progress Notes (Signed)
Post Partum Day 2 Subjective: no complaints, up ad lib, voiding, tolerating PO and + flatus BAby in NICU Objective: Blood pressure 123/82, pulse (!) 52, temperature 98.4 F (36.9 C), temperature source Oral, resp. rate 18, height 5\' 3"  (1.6 m), weight 70.3 kg, last menstrual period 11/29/2017, SpO2 99 %, unknown if currently breastfeeding.  Physical Exam:  General: alert, cooperative and no distress Lochia: appropriate Uterine Fundus: firm Incision: healing well DVT Evaluation: No evidence of DVT seen on physical exam.  Recent Labs    08/30/18 0845 08/31/18 0548  HGB 11.7* 11.0*  HCT 33.2* 31.7*    Assessment/Plan: Discharge home   LOS: 2 days   Mariah Carson 09/01/2018, 8:33 AM

## 2018-09-03 ENCOUNTER — Ambulatory Visit: Payer: Self-pay

## 2018-09-03 NOTE — Lactation Note (Signed)
This note was copied from a baby's chart. Lactation Consultation Note  Patient Name: [name] [id number] Date: 09/03/2018  Called to NICU to check latch.  Baby latched well to breast in cross cradle hold.  Good suck/swallows observed.  Reviewed waking techniques and breast massage with mom.  Mom reports an abundant milk supply.  Answered questions.  Encouraged to call for assist/concerns prn.   Maternal Data    Feeding Feeding Type: Breast Fed  LATCH Score Latch: Grasps breast easily, tongue down, lips flanged, rhythmical sucking.  Audible Swallowing: Spontaneous and intermittent  Type of Nipple: Everted at rest and after stimulation  Comfort (Breast/Nipple): Soft / non-tender  Hold (Positioning): No assistance needed to correctly position infant at breast.  LATCH Score: 10  Interventions    Lactation Tools Discussed/Used     Consult Status      Mariah Carson 09/03/2018, 1:53 PM

## 2019-11-23 ENCOUNTER — Ambulatory Visit: Payer: PRIVATE HEALTH INSURANCE | Attending: Internal Medicine

## 2019-11-23 DIAGNOSIS — Z20822 Contact with and (suspected) exposure to covid-19: Secondary | ICD-10-CM

## 2019-11-25 LAB — NOVEL CORONAVIRUS, NAA: SARS-CoV-2, NAA: NOT DETECTED

## 2021-12-25 ENCOUNTER — Other Ambulatory Visit: Payer: Self-pay | Admitting: Family Medicine

## 2021-12-25 ENCOUNTER — Other Ambulatory Visit (HOSPITAL_COMMUNITY)
Admission: RE | Admit: 2021-12-25 | Discharge: 2021-12-25 | Disposition: A | Discharge status: Home / Self Care | Payer: PRIVATE HEALTH INSURANCE | Source: Ambulatory Visit | Attending: Family Medicine | Admitting: Family Medicine

## 2021-12-25 DIAGNOSIS — Z01411 Encounter for gynecological examination (general) (routine) with abnormal findings: Secondary | ICD-10-CM | POA: Diagnosis not present

## 2021-12-30 LAB — CYTOLOGY - PAP
Comment: NEGATIVE
Diagnosis: UNDETERMINED — AB
High risk HPV: NEGATIVE

## 2022-01-06 ENCOUNTER — Other Ambulatory Visit: Payer: Self-pay | Admitting: Family Medicine

## 2022-01-06 DIAGNOSIS — Z1231 Encounter for screening mammogram for malignant neoplasm of breast: Secondary | ICD-10-CM

## 2022-01-23 ENCOUNTER — Ambulatory Visit
Admission: RE | Admit: 2022-01-23 | Discharge: 2022-01-23 | Disposition: A | Discharge status: Home / Self Care | Payer: PRIVATE HEALTH INSURANCE | Source: Ambulatory Visit | Attending: Family Medicine | Admitting: Family Medicine

## 2022-01-23 DIAGNOSIS — Z1231 Encounter for screening mammogram for malignant neoplasm of breast: Secondary | ICD-10-CM

## 2022-01-26 ENCOUNTER — Other Ambulatory Visit: Payer: Self-pay | Admitting: Family Medicine

## 2022-01-26 DIAGNOSIS — R928 Other abnormal and inconclusive findings on diagnostic imaging of breast: Secondary | ICD-10-CM

## 2022-02-09 ENCOUNTER — Ambulatory Visit
Admission: RE | Admit: 2022-02-09 | Discharge: 2022-02-09 | Disposition: A | Discharge status: Home / Self Care | Payer: PRIVATE HEALTH INSURANCE | Source: Ambulatory Visit | Attending: Family Medicine | Admitting: Family Medicine

## 2022-02-09 ENCOUNTER — Other Ambulatory Visit: Payer: Self-pay | Admitting: Family Medicine

## 2022-02-09 DIAGNOSIS — R928 Other abnormal and inconclusive findings on diagnostic imaging of breast: Secondary | ICD-10-CM

## 2022-02-13 ENCOUNTER — Other Ambulatory Visit: Payer: Self-pay | Admitting: Family Medicine

## 2022-02-13 DIAGNOSIS — R928 Other abnormal and inconclusive findings on diagnostic imaging of breast: Secondary | ICD-10-CM

## 2022-03-03 ENCOUNTER — Ambulatory Visit
Admission: RE | Admit: 2022-03-03 | Discharge: 2022-03-03 | Disposition: A | Discharge status: Home / Self Care | Payer: PRIVATE HEALTH INSURANCE | Source: Ambulatory Visit | Attending: Family Medicine | Admitting: Family Medicine

## 2022-03-03 DIAGNOSIS — R928 Other abnormal and inconclusive findings on diagnostic imaging of breast: Secondary | ICD-10-CM

## 2022-03-03 HISTORY — PX: BREAST BIOPSY: SHX20

## 2022-08-16 IMAGING — US US BREAST BX W LOC DEV 1ST LESION IMG BX SPEC US GUIDE*L*
1 series · 13 of 15 positions shown · non-contrast
Comparison: Previous exam(s).
COMPARISON: Previous exam(s).

Addendum:
CLINICAL DATA: 40-year-old female presenting for biopsy of a mass
in the left breast.

EXAM:
ULTRASOUND GUIDED LEFT BREAST CORE NEEDLE BIOPSY

[Series 1: us breast bx w loc dev 1st lesion img bx spec us g · 0.05mm/px · 13 of 15 slices shown]
[im 1/15]
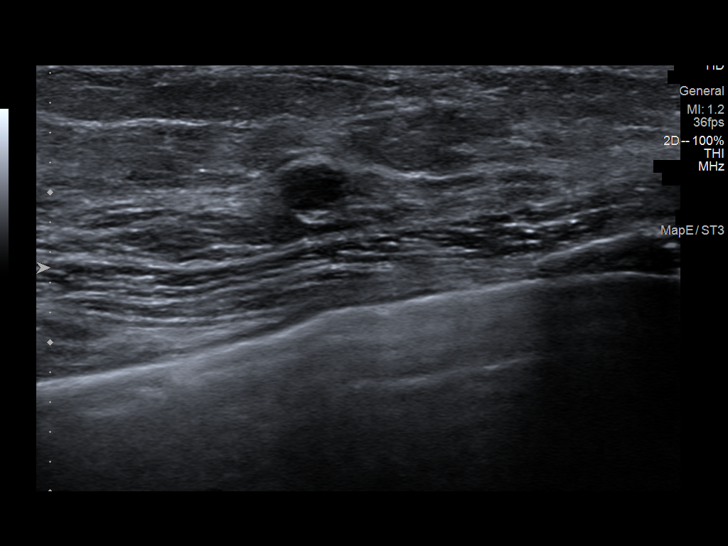
[im 2/15]
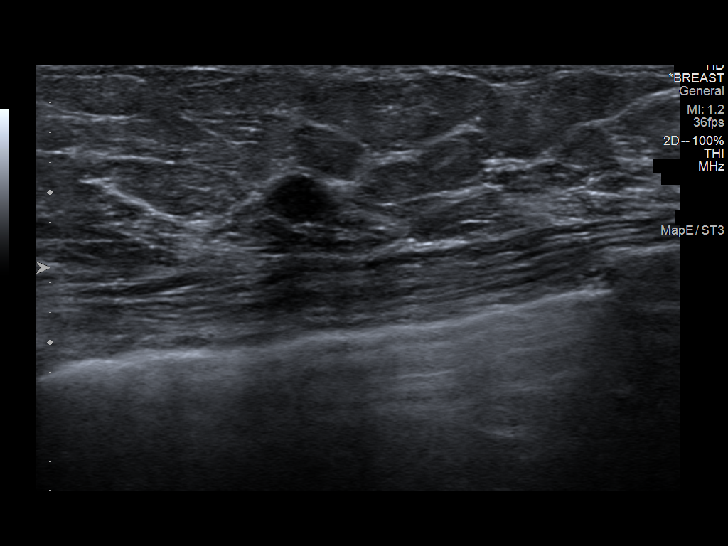
[im 3/15]
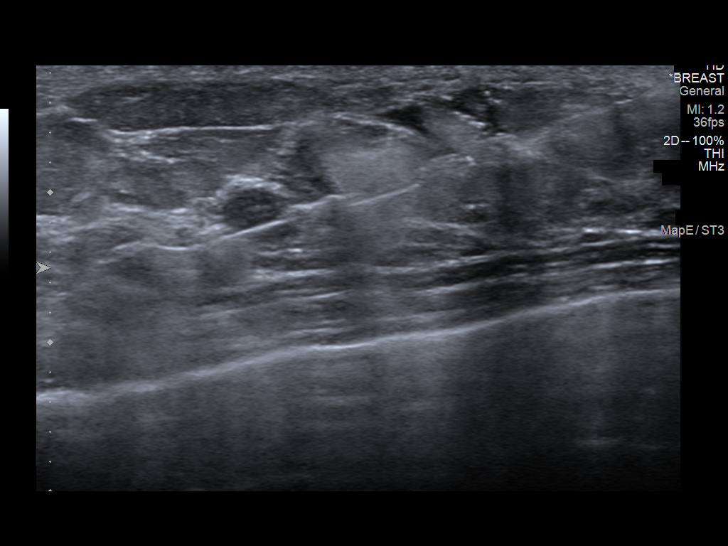
[im 5/15]
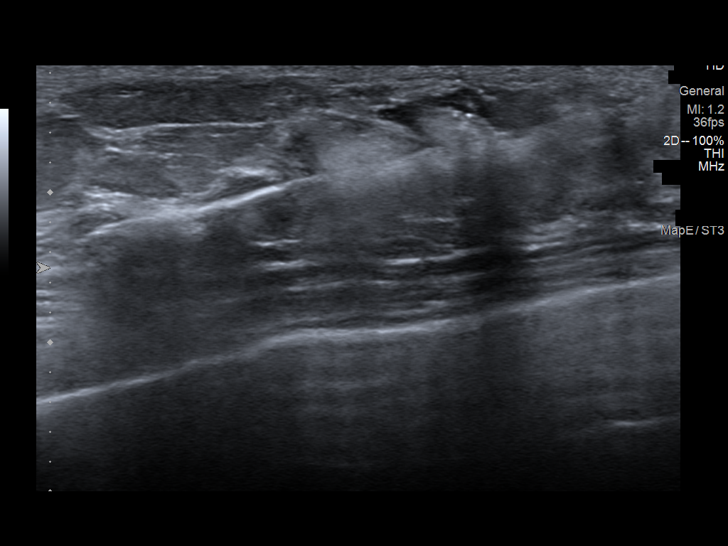
[im 6/15]
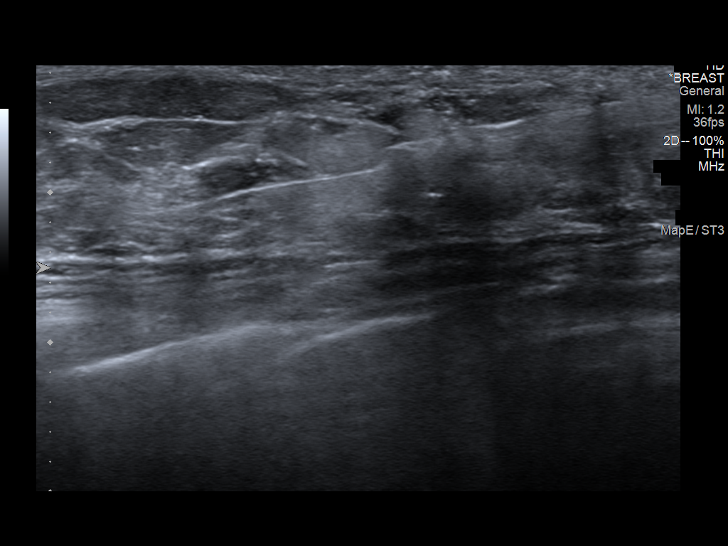
[im 7/15]
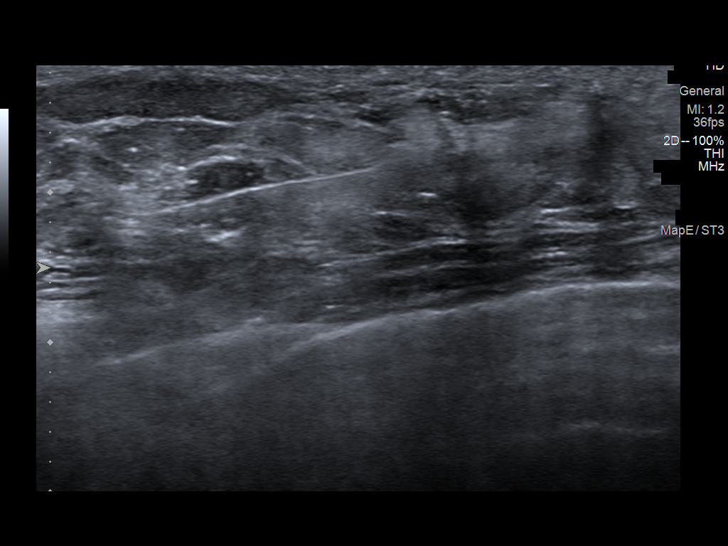
[im 8/15]
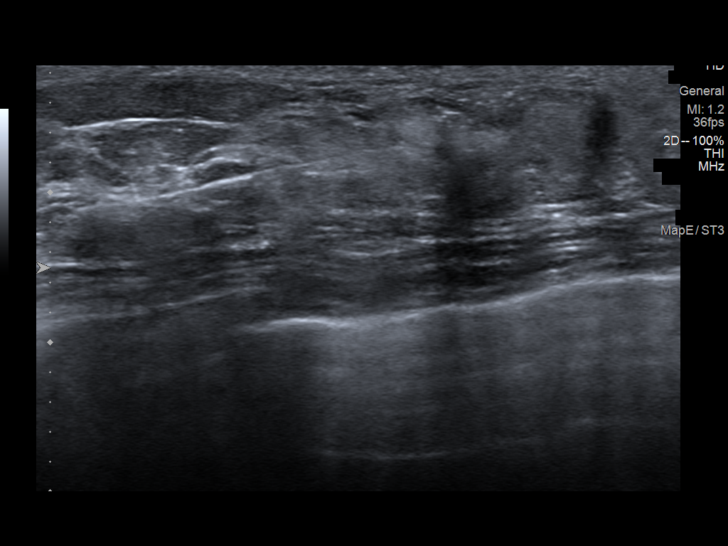
[im 9/15]
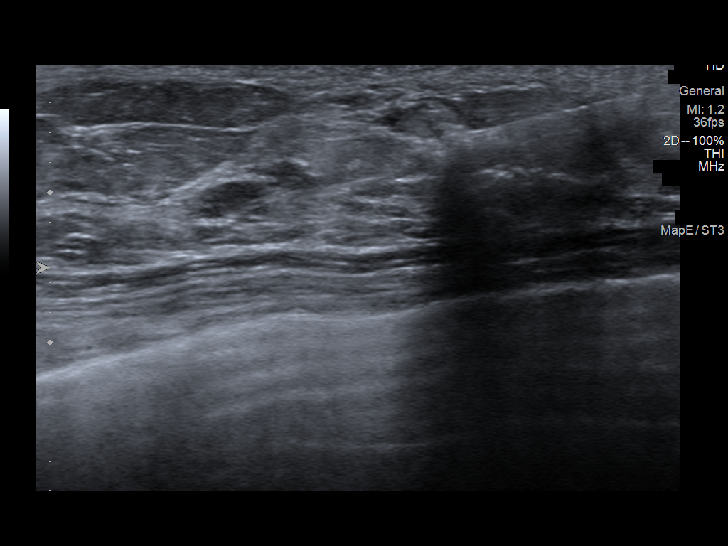
[im 10/15]
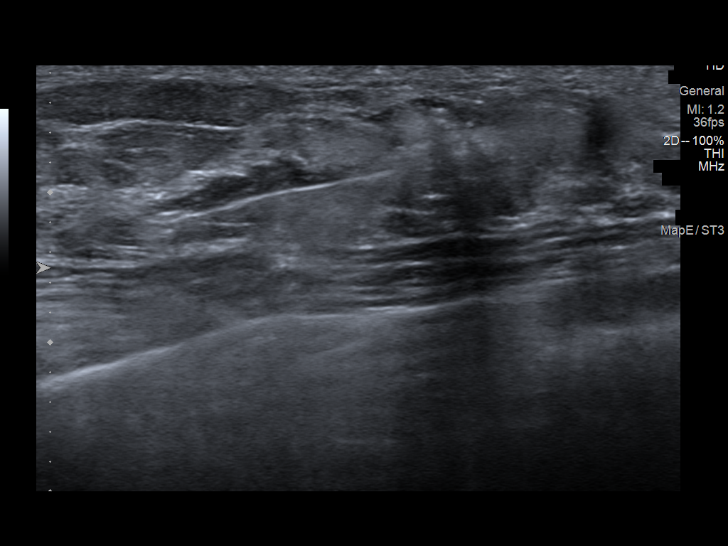
[im 11/15]
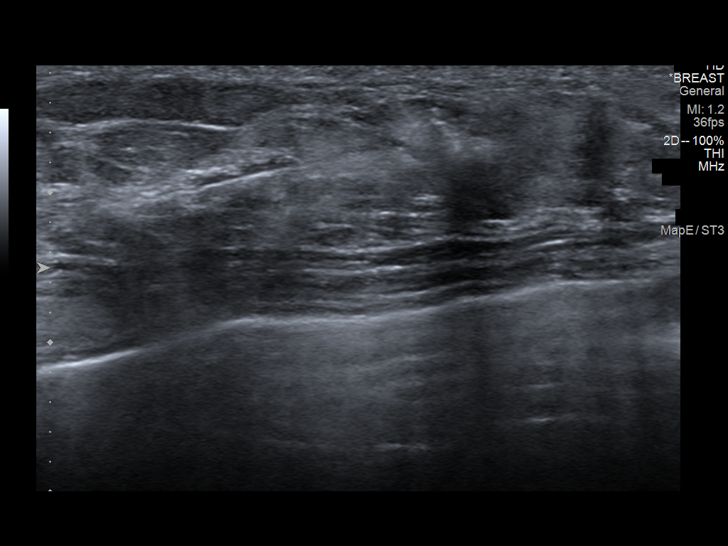
[im 13/15]
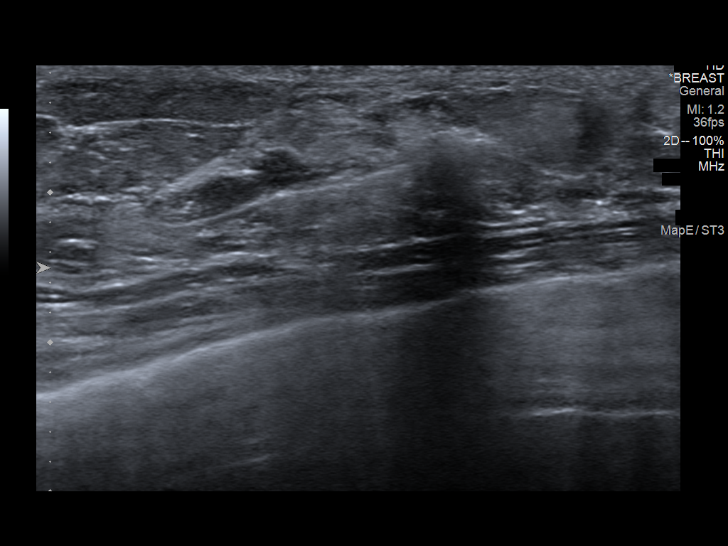
[im 14/15]
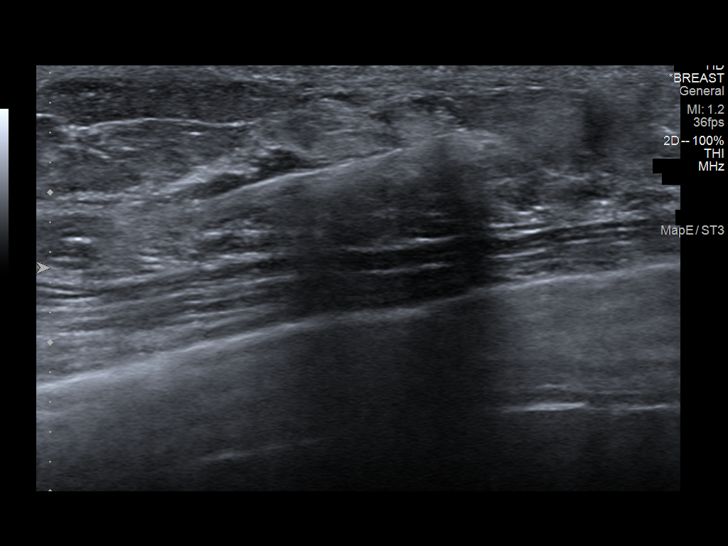
[im 15/15]
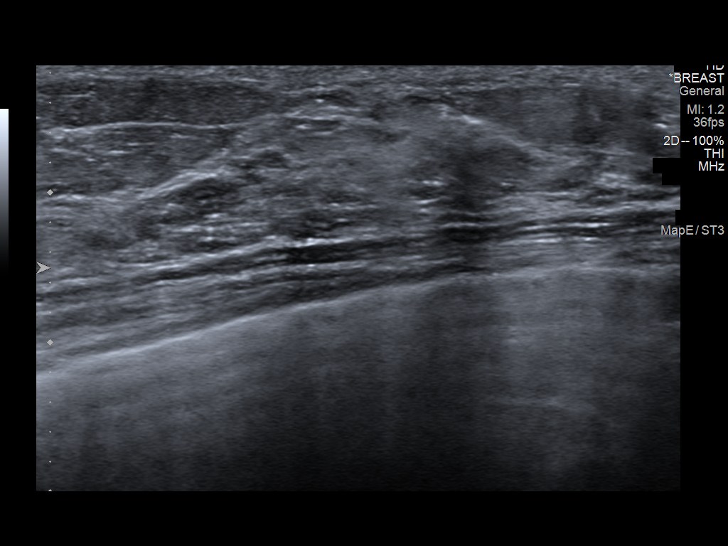

[13 of 15 positions shown; findings below may reference images not displayed]



Lesion quadrant: Upper inner quadrant

Using sterile technique and 1% Lidocaine as local anesthetic, under
direct ultrasound visualization, a 14 gauge Honeycutt device was
used to perform biopsy of a mass in the left breast at 9 o'clock
using a medial approach. At the conclusion of the procedure a ribbon
tissue marker clip was deployed into the biopsy cavity. Follow up 2
view mammogram was performed and dictated separately.
IMPRESSION: Ultrasound guided biopsy of a mass in the left breast at 9 o'clock.
No apparent complications.

ADDENDUM:
Pathology revealed BENIGN FIBROADENOMA of the LEFT breast, 9
o'clock, (ribbon clip). This was found to be concordant by Dr. Tabernacle
Barrientes.

Pathology results were discussed with the patient by telephone. The
patient reported doing well after the biopsy with minimal tenderness
at the site. Post biopsy instructions and care were reviewed and
questions were answered. The patient was encouraged to call The
direct phone number was provided.

The patient was instructed to return for annual screening
mammography in December 2022.

Pathology results reported by Baluwie B Gh, RN on 03/04/2022.



Lesion quadrant: Upper inner quadrant

Using sterile technique and 1% Lidocaine as local anesthetic, under
direct ultrasound visualization, a 14 gauge Honeycutt device was
used to perform biopsy of a mass in the left breast at 9 o'clock
using a medial approach. At the conclusion of the procedure a ribbon
tissue marker clip was deployed into the biopsy cavity. Follow up 2
view mammogram was performed and dictated separately.
IMPRESSION: Ultrasound guided biopsy of a mass in the left breast at 9 o'clock.
No apparent complications.

## 2022-08-21 ENCOUNTER — Other Ambulatory Visit: Payer: PRIVATE HEALTH INSURANCE

## 2023-01-04 ENCOUNTER — Other Ambulatory Visit (HOSPITAL_COMMUNITY)
Admission: RE | Admit: 2023-01-04 | Discharge: 2023-01-04 | Disposition: A | Discharge status: Home / Self Care | Payer: PRIVATE HEALTH INSURANCE | Source: Ambulatory Visit | Attending: Family Medicine | Admitting: Family Medicine

## 2023-01-04 ENCOUNTER — Other Ambulatory Visit: Payer: Self-pay | Admitting: Family Medicine

## 2023-01-04 DIAGNOSIS — Z01411 Encounter for gynecological examination (general) (routine) with abnormal findings: Secondary | ICD-10-CM | POA: Insufficient documentation

## 2023-01-14 LAB — CYTOLOGY - PAP
Comment: NEGATIVE
Diagnosis: NEGATIVE
High risk HPV: NEGATIVE

## 2024-02-01 ENCOUNTER — Other Ambulatory Visit: Payer: Self-pay | Admitting: Family Medicine

## 2024-02-01 DIAGNOSIS — Z1231 Encounter for screening mammogram for malignant neoplasm of breast: Secondary | ICD-10-CM

## 2024-02-10 ENCOUNTER — Ambulatory Visit
Admission: RE | Admit: 2024-02-10 | Discharge: 2024-02-10 | Disposition: A | Discharge status: Home / Self Care | Payer: Self-pay | Source: Ambulatory Visit | Attending: Family Medicine | Admitting: Family Medicine

## 2024-02-10 DIAGNOSIS — Z1231 Encounter for screening mammogram for malignant neoplasm of breast: Secondary | ICD-10-CM

## 2024-12-19 ENCOUNTER — Other Ambulatory Visit: Payer: Self-pay | Admitting: Family Medicine

## 2024-12-19 DIAGNOSIS — Z1231 Encounter for screening mammogram for malignant neoplasm of breast: Secondary | ICD-10-CM

## 2025-02-12 ENCOUNTER — Ambulatory Visit: Payer: Self-pay
